# Patient Record
Sex: Female | Born: 2000 | Race: Black or African American | Hispanic: No | Marital: Single | Smoking: Never smoker
Health system: Southern US, Community
[De-identification: ages and names within clinical notes are randomized; demographics above are authoritative.]

## PROBLEM LIST (undated history)

## (undated) DIAGNOSIS — J45909 Unspecified asthma, uncomplicated: Secondary | ICD-10-CM

## (undated) DIAGNOSIS — J40 Bronchitis, not specified as acute or chronic: Secondary | ICD-10-CM

---
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
## 2022-06-18 ENCOUNTER — Encounter: Payer: Self-pay | Admitting: Obstetrics and Gynecology
